# Patient Record
Sex: Male | Born: 1965 | Race: White | Hispanic: No | Marital: Married | Smoking: Current every day smoker
Health system: Southern US, Community
[De-identification: ages and names within clinical notes are randomized; demographics above are authoritative.]

## PROBLEM LIST (undated history)

## (undated) DIAGNOSIS — I10 Essential (primary) hypertension: Secondary | ICD-10-CM

## (undated) DIAGNOSIS — E785 Hyperlipidemia, unspecified: Secondary | ICD-10-CM

## (undated) HISTORY — DX: Hyperlipidemia, unspecified: E78.5

## (undated) HISTORY — DX: Essential (primary) hypertension: I10

## (undated) HISTORY — PX: HAND SURGERY: SHX662

## (undated) HISTORY — PX: TONSILLECTOMY: SUR1361

---

## 2014-08-22 HISTORY — PX: HERNIA REPAIR: SHX51

## 2014-09-26 ENCOUNTER — Other Ambulatory Visit: Payer: Self-pay | Admitting: Cardiology

## 2014-09-26 ENCOUNTER — Ambulatory Visit
Admission: RE | Admit: 2014-09-26 | Discharge: 2014-09-26 | Disposition: A | Discharge status: Home / Self Care | Payer: BLUE CROSS/BLUE SHIELD | Source: Ambulatory Visit | Attending: Cardiology | Admitting: Cardiology

## 2014-09-26 DIAGNOSIS — F17200 Nicotine dependence, unspecified, uncomplicated: Secondary | ICD-10-CM

## 2016-10-27 ENCOUNTER — Ambulatory Visit
Admission: RE | Admit: 2016-10-27 | Discharge: 2016-10-27 | Disposition: A | Discharge status: Home / Self Care | Source: Ambulatory Visit | Attending: Cardiology | Admitting: Cardiology

## 2016-10-27 ENCOUNTER — Other Ambulatory Visit: Payer: Self-pay | Admitting: Cardiology

## 2016-10-27 DIAGNOSIS — I4510 Unspecified right bundle-branch block: Secondary | ICD-10-CM

## 2019-04-09 ENCOUNTER — Other Ambulatory Visit: Payer: Self-pay | Admitting: Cardiology

## 2019-04-09 DIAGNOSIS — I10 Essential (primary) hypertension: Secondary | ICD-10-CM

## 2019-04-09 DIAGNOSIS — R0609 Other forms of dyspnea: Secondary | ICD-10-CM

## 2019-04-26 ENCOUNTER — Ambulatory Visit: Payer: Self-pay | Admitting: Cardiology

## 2019-05-06 ENCOUNTER — Other Ambulatory Visit: Payer: Self-pay

## 2019-05-06 ENCOUNTER — Ambulatory Visit (INDEPENDENT_AMBULATORY_CARE_PROVIDER_SITE_OTHER): Payer: Self-pay

## 2019-05-06 DIAGNOSIS — I10 Essential (primary) hypertension: Secondary | ICD-10-CM | POA: Diagnosis not present

## 2019-05-07 NOTE — Progress Notes (Signed)
S/w patient advised him of results

## 2019-05-27 ENCOUNTER — Encounter: Payer: Self-pay | Admitting: Cardiology

## 2019-05-27 ENCOUNTER — Ambulatory Visit (INDEPENDENT_AMBULATORY_CARE_PROVIDER_SITE_OTHER): Payer: Self-pay | Admitting: Cardiology

## 2019-05-27 VITALS — BP 118/79 | HR 91 | Ht 71.0 in | Wt 273.0 lb

## 2019-05-27 DIAGNOSIS — I2510 Atherosclerotic heart disease of native coronary artery without angina pectoris: Secondary | ICD-10-CM

## 2019-05-27 DIAGNOSIS — E785 Hyperlipidemia, unspecified: Secondary | ICD-10-CM | POA: Insufficient documentation

## 2019-05-27 DIAGNOSIS — E782 Mixed hyperlipidemia: Secondary | ICD-10-CM

## 2019-05-27 DIAGNOSIS — F17200 Nicotine dependence, unspecified, uncomplicated: Secondary | ICD-10-CM

## 2019-05-27 DIAGNOSIS — I10 Essential (primary) hypertension: Secondary | ICD-10-CM

## 2019-05-27 DIAGNOSIS — I4510 Unspecified right bundle-branch block: Secondary | ICD-10-CM

## 2019-05-27 NOTE — Progress Notes (Signed)
Primary Physician/Referring:  Patient, No Pcp Per  Patient ID: Frank Browning, male    DOB: Jan 09, 1966, 53 y.o.   MRN: [id number]  Chief Complaint  Patient presents with  . Hypertension  . Abnormal ECG  . Follow-up   HPI:    Frank Browning  is a 53 y.o. Caucasian male who presents here for follow-up and evaluation of hypertension. Overall he is doing well and denies any chest pain, shortness of breath, PND or orthopnea. No symptoms to suggest TIA or claudication. He is employed by the [employer] and is required to have a CXR, echo, and GXT performed for clearance to continue performing his job.   [facility], history of coronary calcification by CT scan of the chest in 2017, hypertension, right bundle branch block, mixed hyperlipidemia, tobacco use disorder. He is presently doing well and essentially asymptomatic.   Past Medical History:  Diagnosis Date  . Hyperlipidemia   . Hypertension    Past Surgical History:  Procedure Laterality Date  . HAND SURGERY Right   . HERNIA REPAIR  2016  . TONSILLECTOMY     Social History   Socioeconomic History  . Marital status: Married    Spouse name: Not on file  . Number of children: 2  . Years of education: Not on file  . Highest education level: Not on file  Occupational History  . Not on file  Social Needs  . Financial resource strain: Not on file  . Food insecurity    Worry: Not on file    Inability: Not on file  . Transportation needs    Medical: Not on file    Non-medical: Not on file  Tobacco Use  . Smoking status: Current Every Day Smoker    Packs/day: 1.00    Types: Cigarettes  . Smokeless tobacco: Never Used  Substance and Sexual Activity  . Alcohol use: Not Currently  . Drug use: Never  . Sexual activity: Not on file  Lifestyle  . Physical activity    Days per week: Not on file    Minutes per session: Not on file  . Stress: Not on file  Relationships  . Social  Musicianconnections    Talks on phone: Not on file    Gets together: Not on file    Attends religious service: Not on file    Active member of club or organization: Not on file    Attends meetings of clubs or organizations: Not on file    Relationship status: Not on file  . Intimate partner violence    Fear of current or ex partner: Not on file    Emotionally abused: Not on file    Physically abused: Not on file    Forced sexual activity: Not on file  Other Topics Concern  . Not on file  Social History Narrative  . Not on file   ROS  Review of Systems  Constitution: Negative for chills, decreased appetite, malaise/fatigue and weight gain.  Cardiovascular: Negative for dyspnea on exertion, leg swelling and syncope.  Endocrine: Negative for cold intolerance.  Hematologic/Lymphatic: Does not bruise/bleed easily.  Musculoskeletal: Negative for joint swelling.  Gastrointestinal: Negative for abdominal pain, anorexia, change in bowel habit, hematochezia and melena.  Neurological: Negative for headaches and light-headedness.  Psychiatric/Behavioral: Negative for depression and substance abuse.  All other systems reviewed and are negative.  Objective   Vitals with BMI 05/27/2019  Height 5\' 11"   Weight 273  lbs  BMI 38.09  Systolic 118  Diastolic 79  Pulse 91    Blood pressure 118/79, pulse 91, height 5\' 11"  (1.803 m), weight 273 lb (123.8 kg), SpO2 99 %. Body mass index is 38.08 kg/m.   Physical Exam  Constitutional:  He is well-developed and moderately obese in no acute distress.  HENT:  Head: Atraumatic.  Eyes: Conjunctivae are normal.  Neck: Neck supple. No JVD present. No thyromegaly present.  Cardiovascular: Normal rate, regular rhythm, normal heart sounds and intact distal pulses. Exam reveals no gallop.  No murmur heard. Pulmonary/Chest: Effort normal and breath sounds normal.  Abdominal: Soft. Bowel sounds are normal.  Obese abdomen  Musculoskeletal: Normal range of  motion.  Neurological: He is alert.  Skin: Skin is warm and dry.  Psychiatric: He has a normal mood and affect.   Radiology: No results found.  Laboratory examination:   No results for input(s): NA, K, CL, CO2, GLUCOSE, BUN, CREATININE, CALCIUM, GFRNONAA, GFRAA in the last 8760 hours. No flowsheet data found. No flowsheet data found. Lipid Panel  No results found for: CHOL, TRIG, HDL, CHOLHDL, VLDL, LDLCALC, LDLDIRECT HEMOGLOBIN A1C No results found for: HGBA1C, MPG TSH No results for input(s): TSH in the last 8760 hours. Medications and allergies   Allergies  Allergen Reactions  . Bee Venom      Prior to Admission medications   Medication Sig Start Date End Date Taking? Authorizing Provider  aspirin 325 MG tablet Take 325 mg by mouth daily.   Yes [provider]  atorvastatin (LIPITOR) 80 MG tablet Take 80 mg by mouth daily.   Yes [provider]  EPINEPHrine (EPIPEN IJ) Inject as directed as needed.   Yes [provider]  losartan (COZAAR) 100 MG tablet Take 100 mg by mouth daily.   Yes [provider]  omeprazole (PRILOSEC) 20 MG capsule Take 20 mg by mouth daily.   Yes [provider]     Current Outpatient Medications  Medication Instructions  . aspirin 325 mg, Oral, Daily  . atorvastatin (LIPITOR) 80 mg, Oral, Daily  . EPINEPHrine (EPIPEN IJ) Injection, As needed  . losartan (COZAAR) 100 mg, Oral, Daily  . omeprazole (PRILOSEC) 20 mg, Oral, Daily    Cardiac Studies:   CT scan of the chest 08/03/2016: Calcific coronary artery disease is present. Otherwise no significant abnormality.  Exercise treadmill stress test 05/06/2019: Exercise treadmill stress test was performed using Bruce protocol. Patient reached 11.2 METS, and 108% of age predicted maximum heart rate. Exercise capacity was excellent. Chest pain not reported. Heart rate and hemodynamic response were normal. Stress EKG revealed no ischemic changes. Low risk  study.  Echocardiogram 05/06/2019: Left ventricle cavity is normal in size. Moderate concentric hypertrophy of the left ventricle. Normal LV systolic function with EF 64%. Normal global wall motion. Normal diastolic filling pattern.  Trace mitral regurgitation. Mild tricuspid regurgitation.  No evidence of pulmonary hypertension. No significant change compared to previous study on 04/19/2018.  Assessment     ICD-10-CM   1. Essential hypertension  I10 EKG 12-Lead  2. RBBB  I45.10   3. Coronary artery calcification seen on CAT scan  I25.10   4. Tobacco use disorder  F17.200   5. Mixed hyperlipidemia  E78.2     EKG 05/27/2019: Normal sinus rhythm at rate of 77 bpm, right axis deviation, left posterior fascicular block.  Right bundle branch block.  Bifascicular block.  Poor R-wave progression, borderline low voltage complexes, pulmonary disease pattern. No  significant change from  EKG 10/26/2016.  Recommendations:   Mr. Frank Browning is a Caucasian male who travels frequently with Pharmacist, hospital and follows at [facility] for primary care needs, history of coronary calcification by CT scan of the chest in 2017, hypertension, right bundle branch block, mixed hyperlipidemia, tobacco use disorder presents to me for annual visit to obtain clearance to continue to work in the [employer].  His blood pressure is well controlled, he does have hyperglycemia and hyperlipidemia, I do not have this labs.  I have discussed with him regarding smoking cessation again.  Weight loss discussed.  I reviewed his echocardiogram and stress test with the patient.  He requests hardcopy of the same, will send by MyChart.  He sees me once every 2 years, he will call me again when he has to recertify.  He is scheduled for complete physical and lab evaluation by the [place] next month.  This OV is visible to the patient after he signs up for MyChart. Adrian Prows, MD, [facility] 05/27/2019, 4:08 PM [facility]. [place] Pager:  [phone] Office: [phone] If no answer Cell [phone]

## 2019-07-01 NOTE — Telephone Encounter (Signed)
Labs 06/13/2019: PSA normal, urine analysis normal, BUN 14, creatinine 1.03, potassium 3.9, CMP normal.  Total cholesterol 122, triglycerides 126, HDL 44, LDL 53.  TSH normal at 2.97.  CBC normal.  A1c 6.5%.

## 2019-11-06 ENCOUNTER — Other Ambulatory Visit: Payer: Self-pay | Admitting: *Deleted

## 2019-11-06 ENCOUNTER — Telehealth: Payer: Self-pay

## 2019-11-06 ENCOUNTER — Ambulatory Visit
Admission: RE | Admit: 2019-11-06 | Discharge: 2019-11-06 | Disposition: A | Discharge status: Home / Self Care | Source: Ambulatory Visit | Attending: *Deleted | Admitting: *Deleted

## 2019-11-06 DIAGNOSIS — J42 Unspecified chronic bronchitis: Secondary | ICD-10-CM

## 2019-11-06 DIAGNOSIS — R9389 Abnormal findings on diagnostic imaging of other specified body structures: Secondary | ICD-10-CM

## 2019-11-06 DIAGNOSIS — R0609 Other forms of dyspnea: Secondary | ICD-10-CM

## 2019-11-06 NOTE — Telephone Encounter (Signed)
I left the paper work on B desk. I sent in referral to pulmonary

## 2019-11-06 NOTE — Telephone Encounter (Signed)
Patient dropped off paperwork that he needs filled out by a Pulmonary doctor, so he can start working. He is asking can you put in a referral for him to be seen by pulmonary. He does not have PCP. He left copy of form so you would know what he needs Pulmonary referral for.

## 2019-11-07 NOTE — Telephone Encounter (Signed)
Spoke with patient and let him know that referral was sent to Pulmonary. Patient verbalized understanding.

## 2019-11-15 NOTE — Telephone Encounter (Signed)
From pt

## 2019-11-15 NOTE — Telephone Encounter (Signed)
I placed a referral to pulmonary, did they get it or there is a problem with Epic

## 2020-01-21 NOTE — Telephone Encounter (Signed)
Per pt please read

## 2020-09-04 ENCOUNTER — Telehealth: Payer: Self-pay

## 2020-09-04 IMAGING — DX DG CHEST 2V
2 series · 2 of 2 positions shown · non-contrast
Comparison: 10/27/2016

CLINICAL DATA: Bronchitis, history of prior outside abnormal chest
radiograph ([facility]

EXAM:
CHEST - 2 VIEW

[dg chest 2 view (1 of 2)]
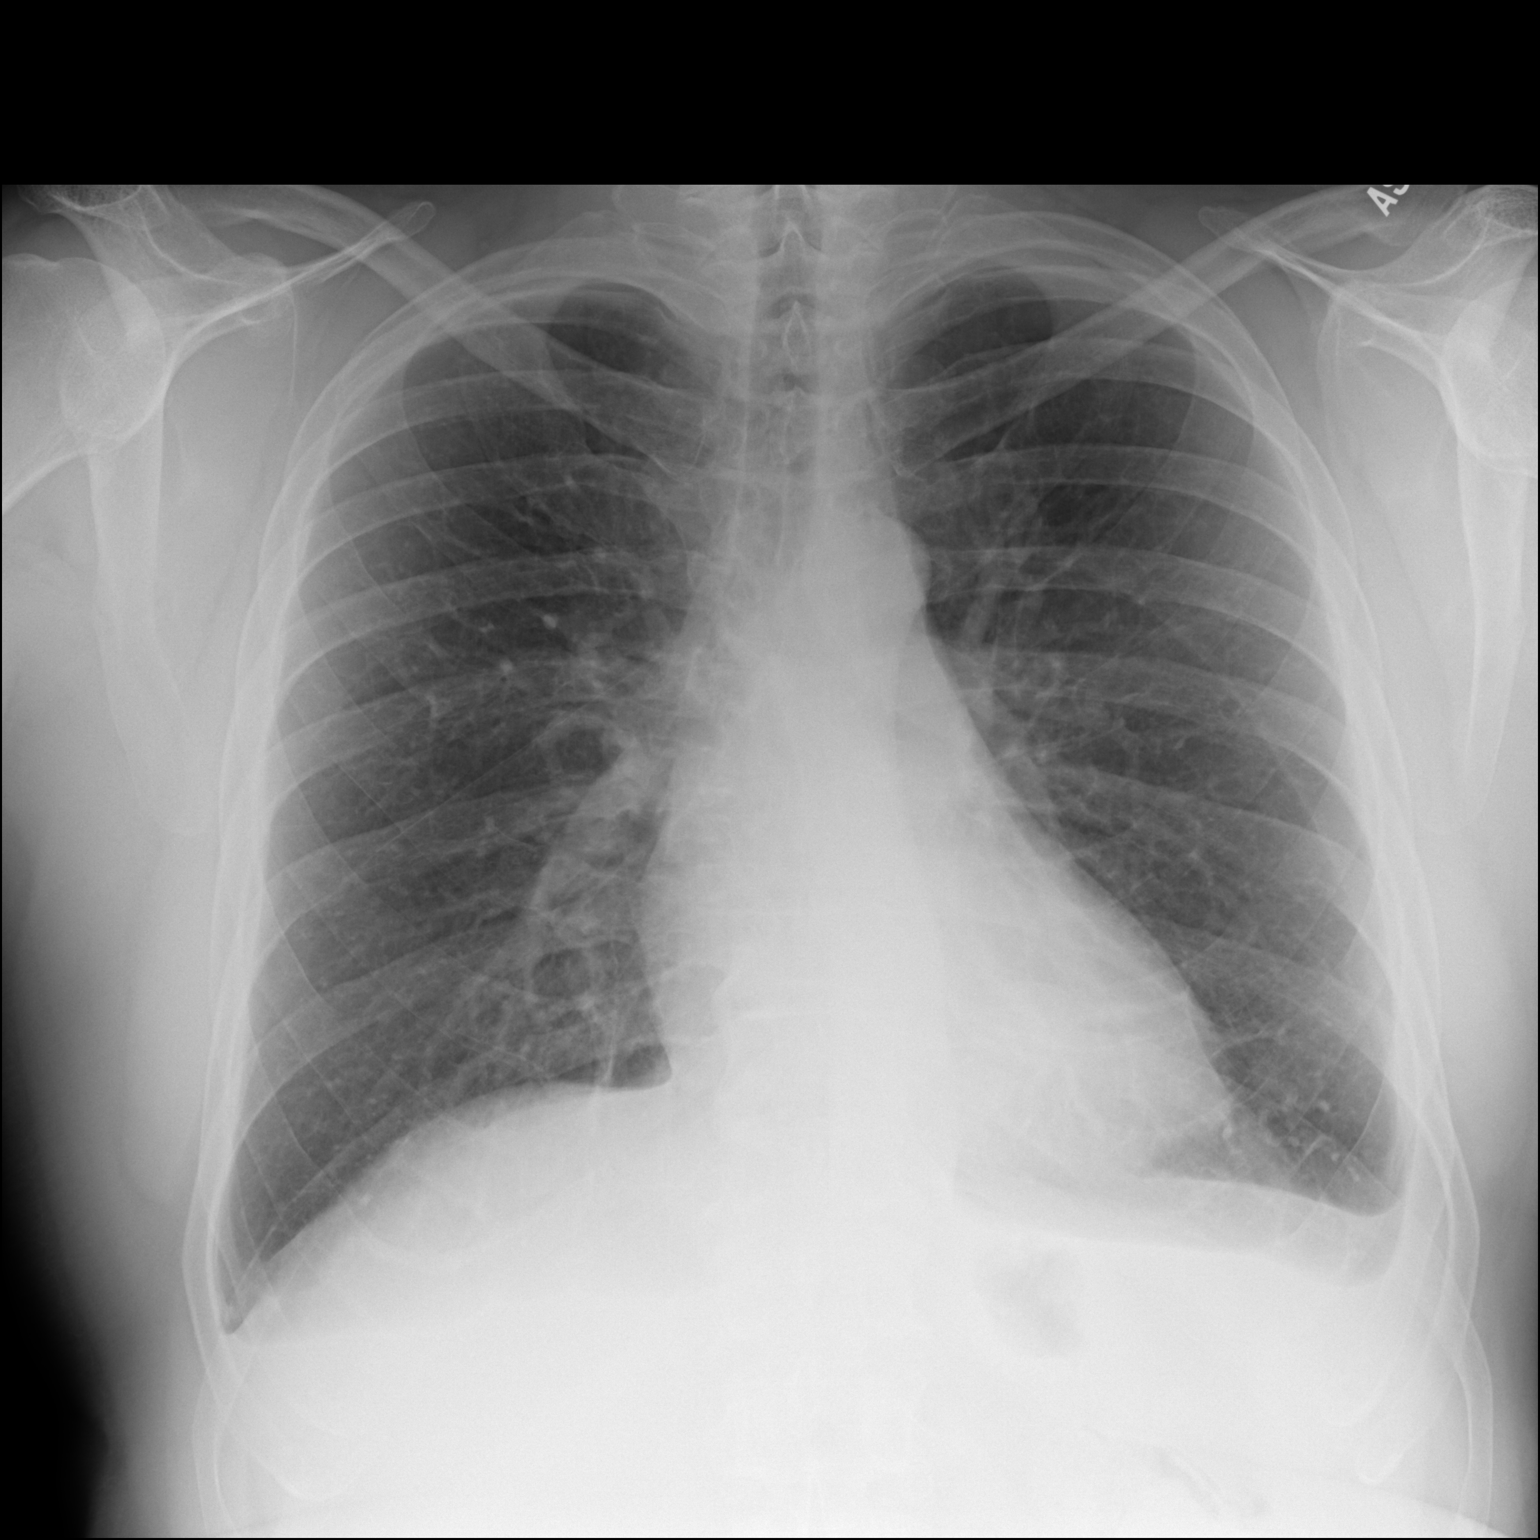

[dg chest 2 view (2 of 2)]
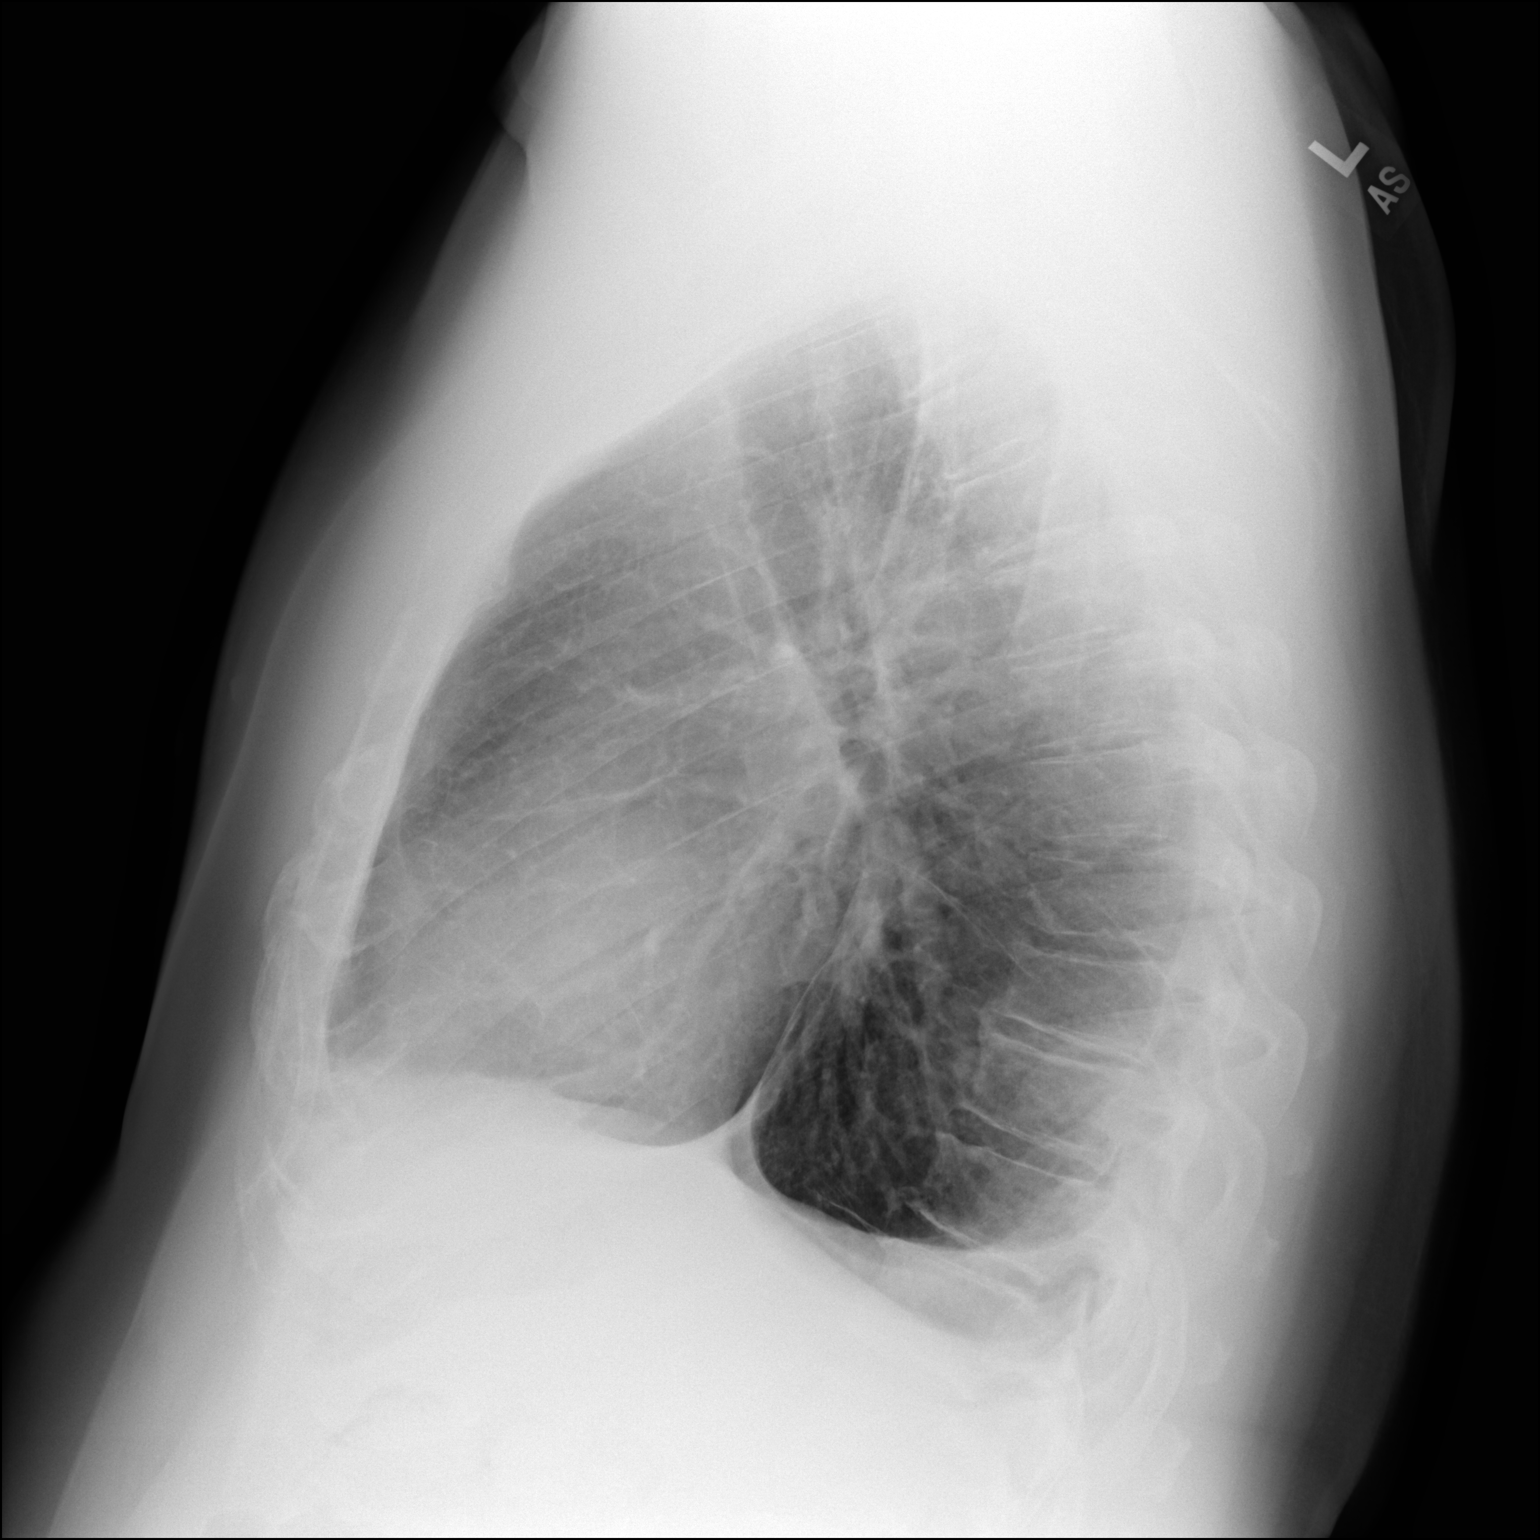

[2 of 2 positions shown; findings below may reference images not displayed]

FINDINGS: Normal heart size, mediastinal contours, and pulmonary vascularity.

Small pleural effusions blunt the costophrenic angles bilaterally,
slightly greater on LEFT.

Lungs otherwise clear.

No pulmonary infiltrate or pneumothorax.

Osseous structures unremarkable.
IMPRESSION: Small bibasilar pleural effusions, greater on LEFT.

## 2020-09-04 NOTE — Telephone Encounter (Signed)
Can you check to see if this referral was ever completed.

## 2021-03-24 ENCOUNTER — Encounter: Payer: Self-pay | Admitting: Pulmonary Disease

## 2021-04-20 ENCOUNTER — Encounter: Payer: Self-pay | Admitting: Pulmonary Disease

## 2021-04-20 ENCOUNTER — Other Ambulatory Visit: Payer: Self-pay

## 2021-04-20 ENCOUNTER — Ambulatory Visit (INDEPENDENT_AMBULATORY_CARE_PROVIDER_SITE_OTHER): Payer: No Typology Code available for payment source | Admitting: Pulmonary Disease

## 2021-04-20 VITALS — BP 118/76 | HR 64 | Ht 71.0 in | Wt 255.2 lb

## 2021-04-20 DIAGNOSIS — J42 Unspecified chronic bronchitis: Secondary | ICD-10-CM

## 2021-04-20 DIAGNOSIS — J90 Pleural effusion, not elsewhere classified: Secondary | ICD-10-CM

## 2021-04-20 NOTE — Progress Notes (Signed)
 @Patient ID: Frank Browning, male    DOB: 07/13/1966, 55 y.o.   MRN: [id number]  Chief Complaint  Patient presents with   Consult    [facility]. States the pain has been mostly on the right side of his chest and right side of his back.     Referring provider: Bhuvaneswaran, Anitha, *  HPI:   55 y.o. man whom we are seeing in consultation for [facility].  [facility] reviewed.  Most recent cardiology note October 2020 reviewed.  Overall patient doing okay.  He describes a couple month history of right-sided chest pain.  Worse with deep inspiration, movement, exertion.  Also occurs at rest.  No time of day when things are better or worse.  No seasonal or environmental factors that makes it better or worse.  No other alleviating or exacerbating factors.  [facility].  10/2020 had CT scan that does not describe significant pleural effusions but did describe bilateral lower lobe atelectasis.  Repeat CT scan 02/2021 with report bilateral right greater than left pleural effusions, 6 mm left upper lobe nodule.  This prompted referral.  He says for the chest pain he was worked up with what sounds like a nuclear stress test that he was told was normal.  He said the stuff they injected of his veins made him feel weird and he did not like it.  Reports history of chronic bronchitis.  Treated with various nasal sprays over time.  Not very effective.  Seems to have used ICS inhalers and albuterol in the past.  Although later he says he was directed to take ICS inhaler and with a modification spray in his nose.  Reviewed chest x-ray 10/2019 on my interpretation shows small left-sided pleural effusion.  Otherwise clear lungs.  Reviewed chest x-ray 11/08/2016 and 10/11/2014 that does not demonstrate any evidence of pleural effusion and also shows clear lungs.  PMH: Hypertension, diabetes, hyperlipidemia, allergies, sleep apnea, chronic  bronchitis Surgical history: Bilateral hernia repair February 2016 Family history: Pancreatic cancer in father, CAD in grandparents Social history: Current smoker, 1 pack/day, works in the [employer], lives in [place]   Questionaires / Pulmonary Flowsheets:   ACT:  No flowsheet data found.  MMRC: No flowsheet data found.  Epworth:  No flowsheet data found.  Tests:   FENO:  No results found for: NITRICOXIDE  PFT: No flowsheet data found.  WALK:  No flowsheet data found.  Imaging: Personally reviewed as per EMR discussion of this note  Lab Results:  CBC No results found for: WBC, RBC, HGB, HCT, PLT, MCV, MCH, MCHC, RDW, LYMPHSABS, MONOABS, EOSABS, BASOSABS  BMET No results found for: NA, K, CL, CO2, GLUCOSE, BUN, CREATININE, CALCIUM, GFRNONAA, GFRAA  BNP No results found for: BNP  ProBNP No results found for: PROBNP  Specialty Problems   None   Allergies  Allergen Reactions   Bee Venom      There is no immunization history on file for this patient.  Past Medical History:  Diagnosis Date   Hyperlipidemia    Hypertension     Tobacco History: Social History   Tobacco Use  Smoking Status Every Day   Packs/day: 1.00   Types: Cigarettes  Smokeless Tobacco Never   Ready to quit: Not Answered Counseling given: Not Answered   Continue to not smoke  Outpatient Encounter Medications as of 04/20/2021  Medication Sig   aspirin   325 MG tablet Take 325 mg by mouth daily.   atorvastatin (LIPITOR) 80 MG tablet Take 80 mg by mouth daily.   calcium carbonate (TUMS - DOSED IN MG ELEMENTAL CALCIUM) 500 MG chewable tablet Chew 1 tablet by mouth daily.   Cholecalciferol 50 MCG (2000 UT) CAPS Take by mouth.   fluticasone (FLONASE) 50 MCG/ACT nasal spray Place 1 spray into both nostrils daily.   losartan (COZAAR) 100 MG tablet Take 100 mg by mouth daily.   metFORMIN (GLUCOPHAGE) 500 MG tablet Take by mouth 2 (two) times daily with a meal.   Omega-3  Fatty Acids (FISH OIL) 1000 MG CAPS Take by mouth.   [DISCONTINUED] aspirin 325 MG tablet Take 325 mg by mouth daily.   [DISCONTINUED] atorvastatin (LIPITOR) 80 MG tablet Take 80 mg by mouth daily.   [DISCONTINUED] EPINEPHrine (EPIPEN IJ) Inject as directed as needed.   [DISCONTINUED] losartan (COZAAR) 100 MG tablet Take 100 mg by mouth daily.   [DISCONTINUED] omeprazole (PRILOSEC) 20 MG capsule Take 20 mg by mouth daily.   No facility-administered encounter medications on file as of 04/20/2021.     Review of Systems  Review of Systems  No orthopnea or PND.  No lower extremity swelling.  Complete review systems otherwise negative. Physical Exam  BP 118/76 (BP Location: Left Arm, Patient Position: Sitting, Cuff Size: Large)   Pulse 64   Ht 5\' 11"  (1.803 m)   Wt 255 lb 3.2 oz (115.8 kg)   SpO2 98% Comment: on RA  BMI 35.59 kg/m   Wt Readings from Last 5 Encounters:  04/20/21 255 lb 3.2 oz (115.8 kg)  05/27/19 273 lb (123.8 kg)    BMI Readings from Last 5 Encounters:  04/20/21 35.59 kg/m  05/27/19 38.08 kg/m     Physical Exam General: Sitting in chair, no distress Eyes: EOMI, no icterus Neck: Supple, no JVP Cardiovascular: Regular rhythm, no murmurs Pulmonary: Diminished bilateral bases, otherwise clear to auscultation, normal work of breathing Abdomen: Nondistended, bowel sounds present MSK: No synovitis, no joint effusion Neuro: Normal gait, no weakness Psych: Normal mood, full affect   Assessment & Plan:   Right-sided pleuritic chest pain: Suspect related to right pleural effusion.  Possible related to hiatal hernia, esophageal pathology.  Pleural effusions: Right given left based on CT report 02/2021 from the 03/2021.  Review of chest x-ray 10/2019 reveals left-sided effusion that appears stable from chest x-ray 1/21 (in 2/21 system I cannot view) per report.  Given bilateral nature, and presence for approaching 2 years, prior LVH and hypertension, suspect related to  cardiogenic volume overload.  It seems he has not been worked up in the [place] system.  Discussed role of thoracentesis for both therapeutic benefit but also for diagnosis given they have not been sampled before he expressed understanding and wishes to pursue this.  We will try to arrange with colleague in the coming day versus myself in the next week or 2.  Pulmonary nodule: Left upper lobe 6 mm, per Fleischner criteria will obtain CT scan in 6 months for follow-up.  He is unsure [facility] will [facility] versus [facility], stressed the importance of ensuring he gets a repeat CT scan January 2023.  He expressed understanding and said he would arrange or call February 2023 back if needed to be done here.   Return in about 2 months (around 06/20/2021).   06/22/2021, MD 04/20/2021

## 2021-04-20 NOTE — Patient Instructions (Signed)
Nice to meet you  Try to arrange the thoracentesis for Thursday afternoon.  If not, I will arrange for next week.  I recommend we do this procedure to help figure out why the fluid is there.  I am most suspicious is effective from the heart symptoms on both sides of your chest, around both lungs not just 1.  Return to clinic in 2 months or sooner as needed with Dr. Judeth Horn

## 2021-04-20 NOTE — H&P (View-Only) (Signed)
@Patient  ID: , male    DOB: August 06, 1966, 55 y.o.   MRN: [id number]  Chief Complaint  Patient presents with   Consult    Referred by [facility] for possible pleural effusion. States the pain has been mostly on the right side of his chest and right side of his back.     Referring provider: [facility], *  HPI:   55 y.o. man whom we are seeing in consultation for evaluation of pleural effusion from the 55.  Note from [place] reviewed.  Most recent cardiology note October 2020 reviewed.  Overall patient doing okay.  He describes a couple month history of right-sided chest pain.  Worse with deep inspiration, movement, exertion.  Also occurs at rest.  No time of day when things are better or worse.  No seasonal or environmental factors that makes it better or worse.  No other alleviating or exacerbating factors.  Worked up via the November 2020.  10/2020 had CT scan that does not describe significant pleural effusions but did describe bilateral lower lobe atelectasis.  Repeat CT scan 02/2021 with report bilateral right greater than left pleural effusions, 6 mm left upper lobe nodule.  This prompted referral.  He says for the chest pain he was worked up with what sounds like a nuclear stress test that he was told was normal.  He said the stuff they injected of his veins made him feel weird and he did not like it.  Reports history of chronic bronchitis.  Treated with various nasal sprays over time.  Not very effective.  Seems to have used ICS inhalers and albuterol in the past.  Although later he says he was directed to take ICS inhaler and with a modification spray in his nose.  Reviewed chest x-ray 10/2019 on my interpretation shows small left-sided pleural effusion.  Otherwise clear lungs.  Reviewed chest x-ray 11/08/2016 and 10/11/2014 that does not demonstrate any evidence of pleural effusion and also shows clear lungs.  PMH: Hypertension, diabetes, hyperlipidemia, allergies, sleep apnea, chronic  bronchitis Surgical history: Bilateral hernia repair February 2016 Family history: Pancreatic cancer in father, CAD in grandparents Social history: Current smoker, 1 pack/day, works in the March 2016, lives in [place] / Pulmonary Flowsheets:   ACT:  No flowsheet data found.  MMRC: No flowsheet data found.  Epworth:  No flowsheet data found.  Tests:   FENO:  No results found for: NITRICOXIDE  PFT: No flowsheet data found.  WALK:  No flowsheet data found.  Imaging: Personally reviewed as per EMR discussion of this note  Lab Results:  CBC No results found for: WBC, RBC, HGB, HCT, PLT, MCV, MCH, MCHC, RDW, LYMPHSABS, MONOABS, EOSABS, BASOSABS  BMET No results found for: NA, K, CL, CO2, GLUCOSE, BUN, CREATININE, CALCIUM, GFRNONAA, GFRAA  BNP No results found for: BNP  ProBNP No results found for: PROBNP  Specialty Problems   None   Allergies  Allergen Reactions   Bee Venom      There is no immunization history on file for this patient.  Past Medical History:  Diagnosis Date   Hyperlipidemia    Hypertension     Tobacco History: Social History   Tobacco Use  Smoking Status Every Day   Packs/day: 1.00   Types: Cigarettes  Smokeless Tobacco Never   Ready to quit: Not Answered Counseling given: Not Answered   Continue to not smoke  Outpatient Encounter Medications as of 56/30/2022  Medication Sig   aspirin  325 MG tablet Take 325 mg by mouth daily.   atorvastatin (LIPITOR) 80 MG tablet Take 80 mg by mouth daily.   calcium carbonate (TUMS - DOSED IN MG ELEMENTAL CALCIUM) 500 MG chewable tablet Chew 1 tablet by mouth daily.   Cholecalciferol 50 MCG (2000 UT) CAPS Take by mouth.   fluticasone (FLONASE) 50 MCG/ACT nasal spray Place 1 spray into both nostrils daily.   losartan (COZAAR) 100 MG tablet Take 100 mg by mouth daily.   metFORMIN (GLUCOPHAGE) 500 MG tablet Take by mouth 2 (two) times daily with a meal.   Omega-3  Fatty Acids (FISH OIL) 1000 MG CAPS Take by mouth.   [DISCONTINUED] aspirin 325 MG tablet Take 325 mg by mouth daily.   [DISCONTINUED] atorvastatin (LIPITOR) 80 MG tablet Take 80 mg by mouth daily.   [DISCONTINUED] EPINEPHrine (EPIPEN IJ) Inject as directed as needed.   [DISCONTINUED] losartan (COZAAR) 100 MG tablet Take 100 mg by mouth daily.   [DISCONTINUED] omeprazole (PRILOSEC) 20 MG capsule Take 20 mg by mouth daily.   No facility-administered encounter medications on file as of 04/20/2021.     Review of Systems  Review of Systems  No orthopnea or PND.  No lower extremity swelling.  Complete review systems otherwise negative. Physical Exam  BP 118/76 (BP Location: Left Arm, Patient Position: Sitting, Cuff Size: Large)   Pulse 64   Ht 5\' 11"  (1.803 m)   Wt 255 lb 3.2 oz (115.8 kg)   SpO2 98% Comment: on RA  BMI 35.59 kg/m   Wt Readings from Last 5 Encounters:  04/20/21 255 lb 3.2 oz (115.8 kg)  05/27/19 273 lb (123.8 kg)    BMI Readings from Last 5 Encounters:  04/20/21 35.59 kg/m  05/27/19 38.08 kg/m     Physical Exam General: Sitting in chair, no distress Eyes: EOMI, no icterus Neck: Supple, no JVP Cardiovascular: Regular rhythm, no murmurs Pulmonary: Diminished bilateral bases, otherwise clear to auscultation, normal work of breathing Abdomen: Nondistended, bowel sounds present MSK: No synovitis, no joint effusion Neuro: Normal gait, no weakness Psych: Normal mood, full affect   Assessment & Plan:   Right-sided pleuritic chest pain: Suspect related to right pleural effusion.  Possible related to hiatal hernia, esophageal pathology.  Pleural effusions: Right given left based on CT report 02/2021 from the 03/2021.  Review of chest x-ray 10/2019 reveals left-sided effusion that appears stable from chest x-ray 1/21 (in 2/21 system I cannot view) per report.  Given bilateral nature, and presence for approaching 2 years, prior LVH and hypertension, suspect related to  cardiogenic volume overload.  It seems he has not been worked up in the [place] system.  Discussed role of thoracentesis for both therapeutic benefit but also for diagnosis given they have not been sampled before he expressed understanding and wishes to pursue this.  We will try to arrange with colleague in the coming day versus myself in the next week or 2.  Pulmonary nodule: Left upper lobe 6 mm, per Fleischner criteria will obtain CT scan in 6 months for follow-up.  He is unsure [facility], stressed the importance of ensuring he gets a repeat CT scan January 2023.  He expressed understanding and said he would arrange or call February 2023 back if needed to be done here.   Return in about 2 months (around 06/20/2021).   06/22/2021, MD 04/20/2021

## 2021-04-21 ENCOUNTER — Telehealth: Payer: Self-pay | Admitting: Pulmonary Disease

## 2021-04-21 NOTE — Telephone Encounter (Signed)
Patient scheduled for Thora on 04/22/21 at [facility] at 2pm. Patient is to be there at 1:30pm.  They are to arrive through the main entrance to go to Admitting.   Called spoke with patient let him know this information. And he was requesting it be sent to his mychart; which I have done.   Nothing further needed at this time.

## 2021-04-22 ENCOUNTER — Encounter (HOSPITAL_COMMUNITY)
Admission: RE | Disposition: A | Discharge status: Home / Self Care | Payer: Self-pay | Source: Home / Self Care | Attending: Pulmonary Disease

## 2021-04-22 ENCOUNTER — Ambulatory Visit (HOSPITAL_COMMUNITY)
Admission: RE | Admit: 2021-04-22 | Discharge: 2021-04-22 | Disposition: A | Discharge status: Home / Self Care | Payer: No Typology Code available for payment source | Attending: Pulmonary Disease | Admitting: Pulmonary Disease

## 2021-04-22 ENCOUNTER — Encounter (HOSPITAL_COMMUNITY): Payer: Self-pay | Admitting: Pulmonary Disease

## 2021-04-22 DIAGNOSIS — Z538 Procedure and treatment not carried out for other reasons: Secondary | ICD-10-CM | POA: Diagnosis not present

## 2021-04-22 DIAGNOSIS — J90 Pleural effusion, not elsewhere classified: Secondary | ICD-10-CM | POA: Diagnosis present

## 2021-04-22 SURGERY — CANCELLED PROCEDURE

## 2021-04-22 NOTE — Interval H&P Note (Signed)
History and Physical Interval Note:  04/22/2021 2:52 PM  Frank Browning  has presented today for surgery, with the diagnosis of pleural effusion.  The various methods of treatment have been discussed with the patient and family. After consideration of risks, benefits and other options for treatment, the patient has consented to  Procedure(s): CANCELLED PROCEDURE as a surgical intervention.  The patient's history has been reviewed, patient examined, no change in status, stable for surgery.  I have reviewed the patient's chart and labs.  Questions were answered to the patient's satisfaction.     Martina Sinner

## 2021-04-22 NOTE — Progress Notes (Signed)
Patient arrived for thoracentesis and has very small pleural effusions. Dr. Francine Graven cancelling procedure for today.

## 2021-04-22 NOTE — Op Note (Signed)
Patient was scheduled for thoracentesis due to bilateral pleural effusions.   Upon ultrasound evaluation, there are small simple bilateral effusions. Given the size of the effusions I felt the risks outweighed the benefits of performing the procedure. I recommended a trial of diuretic therapy with a follow up chest radiograph at the next follow up visit with Dr. Judeth Horn in October.   I have informed Dr. Judeth Horn of today's findings.  Melody Comas, MD [facility] Office: [phone]   See Amion for personal pager PCCM on call pager [phone] until 7pm. Please call Elink 7p-7a. [phone]

## 2021-04-27 DIAGNOSIS — J90 Pleural effusion, not elsewhere classified: Secondary | ICD-10-CM

## 2021-04-27 NOTE — Telephone Encounter (Signed)
MH please adivse. Thanks  Riki Sheer [facility] Pool Dr. Judeth Horn,   As your colleague has probably informed you, the procedure was not performed.     So, what is next?     And, will I be prescribed any medication? If so, has the rqmt been transmitted to my [place] provider?   Thank you for your time.   V/R

## 2021-05-11 MED ORDER — FUROSEMIDE 20 MG PO TABS: 20.0000 mg | ORAL_TABLET | Freq: Every day | ORAL | 0 refills | Status: DC

## 2021-05-11 NOTE — Telephone Encounter (Signed)
I have blood labs from [id number] (attached). Will any of those work so I can obtain and start the medication?   BRgds,   Margaretha Glassing

## 2021-05-11 NOTE — Telephone Encounter (Signed)
Hello Dr. Judeth Horn,  Please see mychart message below, thanks!  Dr. Judeth Horn,   I haven't started/received the medication yet.  The prescription is not in the [place] system. I talked to the [place] community care person. She told me that you should be able to authorize blood tests as part of the course of treatment.  Much easier to do them with you guys I think.   Also, I'll be travelling for the next 2 weeks. I depart early Weds. morning (21SEP) and I'll be back towards the end of the first week of October.  I reckon I'll end up driving about 4-3O miles.   Best Regards,   Frank Browning

## 2021-05-20 NOTE — Telephone Encounter (Signed)
Hello Dr. Judeth Horn, please see mychart message below, thanks  What time of day should I take the med

## 2021-05-28 ENCOUNTER — Other Ambulatory Visit (INDEPENDENT_AMBULATORY_CARE_PROVIDER_SITE_OTHER): Payer: No Typology Code available for payment source

## 2021-05-28 DIAGNOSIS — J90 Pleural effusion, not elsewhere classified: Secondary | ICD-10-CM

## 2021-05-28 LAB — BASIC METABOLIC PANEL
BUN: 16 mg/dL (ref 6–23)
CO2: 29 mEq/L (ref 19–32)
Calcium: 9.6 mg/dL (ref 8.4–10.5)
Chloride: 104 mEq/L (ref 96–112)
Creatinine, Ser: 0.83 mg/dL (ref 0.40–1.50)
GFR: 97.93 mL/min (ref >60.00)
Glucose, Bld: 100 mg/dL — ABNORMAL HIGH (ref 70–99)
Potassium: 3.9 mEq/L (ref 3.5–5.1)
Sodium: 139 mEq/L (ref 135–145)

## 2021-05-28 LAB — BRAIN NATRIURETIC PEPTIDE: Pro B Natriuretic peptide (BNP): 10 pg/mL (ref 0.0–100.0)

## 2021-05-28 LAB — MAGNESIUM: Magnesium: 1.8 mg/dL (ref 1.5–2.5)

## 2021-05-31 NOTE — Progress Notes (Signed)
Blood work looks ok

## 2021-06-15 ENCOUNTER — Other Ambulatory Visit: Payer: Self-pay

## 2021-06-15 ENCOUNTER — Ambulatory Visit: Payer: No Typology Code available for payment source

## 2021-06-15 ENCOUNTER — Encounter: Payer: Self-pay | Admitting: Pulmonary Disease

## 2021-06-15 ENCOUNTER — Ambulatory Visit (INDEPENDENT_AMBULATORY_CARE_PROVIDER_SITE_OTHER): Payer: No Typology Code available for payment source | Admitting: Pulmonary Disease

## 2021-06-15 VITALS — BP 132/82 | HR 81 | Temp 98.3°F | Ht 71.0 in | Wt 252.4 lb

## 2021-06-15 DIAGNOSIS — R053 Chronic cough: Secondary | ICD-10-CM | POA: Diagnosis not present

## 2021-06-15 DIAGNOSIS — R0609 Other forms of dyspnea: Secondary | ICD-10-CM | POA: Diagnosis not present

## 2021-06-15 MED ORDER — FUROSEMIDE 20 MG PO TABS: 20.0000 mg | ORAL_TABLET | Freq: Every day | ORAL | 3 refills | Status: AC

## 2021-06-15 NOTE — Patient Instructions (Signed)
Nice to see you again  Continue taking Lasix 20 mg daily  At next visit we will get a chest x-ray, pulmonary function test, lab work.  I will work on the paperwork you gave me  Return to clinic early January 2023 with pulmonary function test same day, to see Dr. Judeth Horn

## 2021-06-15 NOTE — Progress Notes (Signed)
@Patient  ID: , male    DOB: 29-Jan-1966, 55 y.o.   MRN: [id number]  Chief Complaint  Patient presents with   Follow-up    Patient states coughing and SOB has improved    Referring provider: Clinic, [id number]  HPI:   55 y.o. man whom we are seeing in consultation for follow-up of pleural effusion and dyspnea on exertion from the 59.    Since last visit had scheduled thoracentesis.  No tappable pocket at time of evaluation.  Discussed with patient via phone.  Decided to trial low-dose Lasix, 20 mg daily.  Repeat labs with stable electrolytes, creatinine.  Overall seems improved symptoms.  Less chest tightness, chest discomfort.  Improved dyspnea.  Improved cough.  He endorses GERD refractory to PPI.  Uses Tums a couple times a week.  Still has ongoing dry cough.  HPI at initial visit Overall patient doing okay.  He describes a couple month history of right-sided chest pain.  Worse with deep inspiration, movement, exertion.  Also occurs at rest.  No time of day when things are better or worse.  No seasonal or environmental factors that makes it better or worse.  No other alleviating or exacerbating factors.  Worked up via the [place].  10/2020 had CT scan that does not describe significant pleural effusions but did describe bilateral lower lobe atelectasis.  Repeat CT scan 02/2021 with report bilateral right greater than left pleural effusions, 6 mm left upper lobe nodule.  This prompted referral.  He says for the chest pain he was worked up with what sounds like a nuclear stress test that he was told was normal.  He said the stuff they injected of his veins made him feel weird and he did not like it.  Reports history of chronic bronchitis.  Treated with various nasal sprays over time.  Not very effective.  Seems to have used ICS inhalers and albuterol in the past.  Although later he says he was directed to take ICS inhaler and with a modification spray in his nose.  Reviewed  chest x-ray 10/2019 on my interpretation shows small left-sided pleural effusion.  Otherwise clear lungs.  Reviewed chest x-ray 11/08/2016 and 10/11/2014 that does not demonstrate any evidence of pleural effusion and also shows clear lungs.  PMH: Hypertension, diabetes, hyperlipidemia, allergies, sleep apnea, chronic bronchitis Surgical history: Bilateral hernia repair February 2016 Family history: Pancreatic cancer in father, CAD in grandparents Social history: Current smoker, 1 pack/day, works in the March 2016, lives in [employer] / Pulmonary Flowsheets:   ACT:  No flowsheet data found.  MMRC: No flowsheet data found.  Epworth:  No flowsheet data found.  Tests:   FENO:  No results found for: NITRICOXIDE  PFT: No flowsheet data found.  WALK:  No flowsheet data found.  Imaging: Personally reviewed as per EMR discussion of this note  Lab Results:  CBC No results found for: WBC, RBC, HGB, HCT, PLT, MCV, MCH, MCHC, RDW, LYMPHSABS, MONOABS, EOSABS, BASOSABS  BMET    Component Value Date/Time   NA 139 05/28/2021 0903   K 3.9 05/28/2021 0903   CL 104 05/28/2021 0903   CO2 29 05/28/2021 0903   GLUCOSE 100 (H) 05/28/2021 0903   BUN 16 05/28/2021 0903   CREATININE 0.83 05/28/2021 0903   CALCIUM 9.6 05/28/2021 0903    BNP No results found for: BNP  ProBNP    Component Value Date/Time   PROBNP 10.0 05/28/2021 0903  Specialty Problems   None   Allergies  Allergen Reactions   Bee Venom     As a child    Immunization History  Administered Date(s) Administered   Influenza,inj,Quad PF,6+ Mos 06/03/2021    Past Medical History:  Diagnosis Date   Hyperlipidemia    Hypertension     Tobacco History: Social History   Tobacco Use  Smoking Status Every Day   Types: Cigarettes  Smokeless Tobacco Never  Tobacco Comments   Less than 1 ppd   Ready to quit: Not Answered Counseling given: Not Answered Tobacco comments: Less than 1  ppd   Continue to not smoke  Outpatient Encounter Medications as of 06/15/2021  Medication Sig   aspirin 325 MG EC tablet Take 325 mg by mouth daily.   atorvastatin (LIPITOR) 80 MG tablet Take 80 mg by mouth daily with supper.   calcium carbonate (TUMS - DOSED IN MG ELEMENTAL CALCIUM) 500 MG chewable tablet Chew 2 tablets by mouth every 8 (eight) hours as needed for indigestion or heartburn.   Cholecalciferol 50 MCG (2000 UT) CAPS Take 2,000 Units by mouth daily.   fluticasone (FLONASE) 50 MCG/ACT nasal spray Place 1 spray into both nostrils daily. As needed   losartan (COZAAR) 100 MG tablet Take 100 mg by mouth daily.   metFORMIN (GLUCOPHAGE) 500 MG tablet Take 500 mg by mouth 2 (two) times daily with a meal.   Omega-3 Fatty Acids (FISH OIL) 1000 MG CAPS Take 1,000 mg by mouth daily.   omeprazole (PRILOSEC) 40 MG capsule Take 40 mg by mouth daily with breakfast.   polyvinyl alcohol (LIQUIFILM TEARS) 1.4 % ophthalmic solution Place 1 drop into both eyes every 4 (four) hours as needed for dry eyes.   [DISCONTINUED] furosemide (LASIX) 20 MG tablet Take 1 tablet (20 mg total) by mouth daily.   furosemide (LASIX) 20 MG tablet Take 1 tablet (20 mg total) by mouth daily.   No facility-administered encounter medications on file as of 06/15/2021.     Review of Systems  Review of Systems  N/a Physical Exam  BP 132/82 (BP Location: Right Arm, Cuff Size: Normal)   Pulse 81   Temp 98.3 F (36.8 C)   Ht 5\' 11"  (1.803 m)   Wt 252 lb 6.4 oz (114.5 kg)   SpO2 97%   BMI 35.20 kg/m   Wt Readings from Last 5 Encounters:  06/15/21 252 lb 6.4 oz (114.5 kg)  04/20/21 255 lb 3.2 oz (115.8 kg)  05/27/19 273 lb (123.8 kg)    BMI Readings from Last 5 Encounters:  06/15/21 35.20 kg/m  04/20/21 35.59 kg/m  05/27/19 38.08 kg/m     Physical Exam General: Sitting in chair, no distress Eyes: EOMI, no icterus Neck: Supple, no JVP Cardiovascular: Regular rhythm, no murmurs Pulmonary:   clear to auscultation, normal work of breathing Abdomen: Nondistended, bowel sounds present MSK: No synovitis, no joint effusion Neuro: Normal gait, no weakness Psych: Normal mood, full affect   Assessment & Plan:   Right-sided pleuritic chest pain: Suspect related to right pleural effusion.  Possible related to hiatal hernia, esophageal pathology.  Improving with diuresis.  DOE: Improved with Lasix.  PFTs at next visit for further evaluation.  Reportedly normal spirometry in the past per his report.  Pleural effusions: Right given left based on CT report 02/2021 from the 03/2021.  Review of chest x-ray 10/2019 reveals left-sided effusion that appears stable from chest x-ray 1/21 (in 2/21 system I cannot view) per report.  Given bilateral nature, and presence for approaching 2 years, prior LVH and hypertension, suspect related to cardiogenic volume overload.  pocket on attempted thoracentesis 04/2021.  symptoms and exam improved with Lasix therapy.  Plan for repeat chest x-ray, BMP at next visit 08/2021.   Pulmonary nodule: Left upper lobe 6 mm, per Fleischner criteria will obtain CT scan in 6 months for follow-up.  [place] [facility] [place] [facility] [place], stressed the importance of ensuring he gets a repeat CT scan January 2023.  He expressed understanding and said he would arrange or call us back if needed to be done here.   Return in about 3 months (around 09/15/2021).   Karren Burly, MD 06/15/2021

## 2021-06-23 NOTE — Telephone Encounter (Signed)
Just FYI  Dr. Judeth Horn,   Just letting you know I scheduled the CT [facility] for [id number] [facility].   Hopefully, the reading will be available prior to our appt on 06JAN.   V/R   Margaretha Glassing

## 2021-06-24 NOTE — Telephone Encounter (Signed)
Will forward to MH to review.

## 2021-08-27 ENCOUNTER — Ambulatory Visit (INDEPENDENT_AMBULATORY_CARE_PROVIDER_SITE_OTHER): Payer: No Typology Code available for payment source | Admitting: Pulmonary Disease

## 2021-08-27 ENCOUNTER — Other Ambulatory Visit: Payer: Self-pay

## 2021-08-27 ENCOUNTER — Encounter: Payer: Self-pay | Admitting: Pulmonary Disease

## 2021-08-27 VITALS — BP 130/72 | HR 82 | Temp 98.8°F | Ht 71.0 in | Wt 266.0 lb

## 2021-08-27 DIAGNOSIS — R0609 Other forms of dyspnea: Secondary | ICD-10-CM

## 2021-08-27 DIAGNOSIS — R053 Chronic cough: Secondary | ICD-10-CM

## 2021-08-27 LAB — PULMONARY FUNCTION TEST
DL/VA % pred: 102 %
DL/VA: 4.42 ml/min/mmHg/L
DLCO cor % pred: 74 %
DLCO cor: 21.67 ml/min/mmHg
DLCO unc % pred: 74 %
DLCO unc: 21.67 ml/min/mmHg
FEF 25-75 Post: 2.3 L/sec
FEF 25-75 Pre: 1.97 L/sec
FEF2575-%Change-Post: 16 %
FEF2575-%Pred-Post: 70 %
FEF2575-%Pred-Pre: 60 %
FEV1-%Change-Post: 4 %
FEV1-%Pred-Post: 68 %
FEV1-%Pred-Pre: 66 %
FEV1-Post: 2.67 L
FEV1-Pre: 2.56 L
FEV1FVC-%Change-Post: 3 %
FEV1FVC-%Pred-Pre: 96 %
FEV6-%Change-Post: 1 %
FEV6-%Pred-Post: 71 %
FEV6-%Pred-Pre: 71 %
FEV6-Post: 3.49 L
FEV6-Pre: 3.45 L
FEV6FVC-%Pred-Post: 104 %
FEV6FVC-%Pred-Pre: 104 %
FVC-%Change-Post: 1 %
FVC-%Pred-Post: 69 %
FVC-%Pred-Pre: 68 %
FVC-Post: 3.49 L
FVC-Pre: 3.45 L
Post FEV1/FVC ratio: 76 %
Post FEV6/FVC ratio: 100 %
Pre FEV1/FVC ratio: 74 %
Pre FEV6/FVC Ratio: 100 %
RV % pred: 62 %
RV: 1.39 L
TLC % pred: 67 %
TLC: 4.84 L

## 2021-08-27 NOTE — Progress Notes (Signed)
Full PFT performed today. °

## 2021-08-27 NOTE — Progress Notes (Signed)
@Patient  ID: , male    DOB: 1966-05-18, 56 y.o.   MRN: [id number]  Chief Complaint  Patient presents with   Follow-up    Follow up for cough. Pt states that cough comes and goes.     Referring provider: Clinic, [id number]  HPI:   56 y.o. man whom we are seeing in consultation for follow-up of pleural effusion and dyspnea on exertion from the 59.    Since last visit, no changes.  No change in symptoms.  Overall doing well.  Continues Lasix 20 mg daily.  PFT performed.  Reviewed in depth.  Consistent with moderate restriction and mild DLCO reduction.  Reviewed reported most recent CT scan 2022 that does not demonstrate any findings of ILD per the report.  Suspect largely related to chest wall interference, body habitus.  HPI at initial visit Overall patient doing okay.  He describes a couple month history of right-sided chest pain.  Worse with deep inspiration, movement, exertion.  Also occurs at rest.  No time of day when things are better or worse.  No seasonal or environmental factors that makes it better or worse.  No other alleviating or exacerbating factors.  Worked up via the 2023.  10/2020 had CT scan that does not describe significant pleural effusions but did describe bilateral lower lobe atelectasis.  Repeat CT scan 02/2021 with report bilateral right greater than left pleural effusions, 6 mm left upper lobe nodule.  This prompted referral.  He says for the chest pain he was worked up with what sounds like a nuclear stress test that he was told was normal.  He said the stuff they injected of his veins made him feel weird and he did not like it.  Reports history of chronic bronchitis.  Treated with various nasal sprays over time.  Not very effective.  Seems to have used ICS inhalers and albuterol in the past.  Although later he says he was directed to take ICS inhaler and with a modification spray in his nose.  Reviewed chest x-ray 10/2019 on my interpretation shows  small left-sided pleural effusion.  Otherwise clear lungs.  Reviewed chest x-ray 11/08/2016 and 10/11/2014 that does not demonstrate any evidence of pleural effusion and also shows clear lungs.  PMH: Hypertension, diabetes, hyperlipidemia, allergies, sleep apnea, chronic bronchitis Surgical history: Bilateral hernia repair February 2016 Family history: Pancreatic cancer in father, CAD in grandparents Social history: Current smoker, 1 pack/day, works in the March 2016, lives in [employer] / Pulmonary Flowsheets:   ACT:  No flowsheet data found.  MMRC: No flowsheet data found.  Epworth:  No flowsheet data found.  Tests:   FENO:  No results found for: NITRICOXIDE  PFT: PFT Results Latest Ref Rng & Units 08/27/2021  FVC-Pre L 3.45  FVC-Predicted Pre % 68  FVC-Post L 3.49  FVC-Predicted Post % 69  Pre FEV1/FVC % % 74  Post FEV1/FCV % % 76  FEV1-Pre L 2.56  FEV1-Predicted Pre % 66  FEV1-Post L 2.67  DLCO uncorrected ml/min/mmHg 21.67  DLCO UNC% % 74  DLCO corrected ml/min/mmHg 21.67  DLCO COR %Predicted % 74  DLVA Predicted % 102  TLC L 4.84  TLC % Predicted % 67  RV % Predicted % 62  Personally reviewed and interpreted as spirometry suggestive of moderate restriction, no bronchodilator response, TLC consistent with moderate restriction, DLCO mildly reduced just below lower limit of normal  WALK:  No flowsheet data found.  Imaging:  Personally reviewed as per EMR discussion of this note  Lab Results:  CBC No results found for: WBC, RBC, HGB, HCT, PLT, MCV, MCH, MCHC, RDW, LYMPHSABS, MONOABS, EOSABS, BASOSABS  BMET    Component Value Date/Time   NA 139 05/28/2021 0903   K 3.9 05/28/2021 0903   CL 104 05/28/2021 0903   CO2 29 05/28/2021 0903   GLUCOSE 100 (H) 05/28/2021 0903   BUN 16 05/28/2021 0903   CREATININE 0.83 05/28/2021 0903   CALCIUM 9.6 05/28/2021 0903    BNP No results found for: BNP  ProBNP    Component Value Date/Time    PROBNP 10.0 05/28/2021 0903    Specialty Problems   None   Allergies  Allergen Reactions   Bee Venom     As a child    Immunization History  Administered Date(s) Administered   Influenza,inj,Quad PF,6+ Mos 06/03/2021    Past Medical History:  Diagnosis Date   Hyperlipidemia    Hypertension     Tobacco History: Social History   Tobacco Use  Smoking Status Every Day   Types: Cigarettes  Smokeless Tobacco Never  Tobacco Comments   Less than 1 ppd   Ready to quit: Not Answered Counseling given: Not Answered Tobacco comments: Less than 1 ppd   Continue to not smoke  Outpatient Encounter Medications as of 08/27/2021  Medication Sig   aspirin 325 MG EC tablet Take 325 mg by mouth daily.   atorvastatin (LIPITOR) 80 MG tablet Take 80 mg by mouth daily with supper.   calcium carbonate (TUMS - DOSED IN MG ELEMENTAL CALCIUM) 500 MG chewable tablet Chew 2 tablets by mouth every 8 (eight) hours as needed for indigestion or heartburn.   Cholecalciferol 50 MCG (2000 UT) CAPS Take 2,000 Units by mouth daily.   fluticasone (FLONASE) 50 MCG/ACT nasal spray Place 1 spray into both nostrils daily. As needed   furosemide (LASIX) 20 MG tablet Take 1 tablet (20 mg total) by mouth daily.   losartan (COZAAR) 100 MG tablet Take 100 mg by mouth daily.   metFORMIN (GLUCOPHAGE) 500 MG tablet Take 500 mg by mouth 2 (two) times daily with a meal.   Omega-3 Fatty Acids (FISH OIL) 1000 MG CAPS Take 1,000 mg by mouth daily.   omeprazole (PRILOSEC) 40 MG capsule Take 40 mg by mouth daily with breakfast.   polyvinyl alcohol (LIQUIFILM TEARS) 1.4 % ophthalmic solution Place 1 drop into both eyes every 4 (four) hours as needed for dry eyes.   No facility-administered encounter medications on file as of 08/27/2021.     Review of Systems  Review of Systems  N/a Physical Exam  BP 130/72 (BP Location: Left Arm, Cuff Size: Normal)    Pulse 82    Temp 98.8 F (37.1 C) (Oral)    Ht 5\' 11"  (1.803 m)     Wt 266 lb (120.7 kg)    SpO2 97%    BMI 37.10 kg/m   Wt Readings from Last 5 Encounters:  08/27/21 266 lb (120.7 kg)  06/15/21 252 lb 6.4 oz (114.5 kg)  04/20/21 255 lb 3.2 oz (115.8 kg)  05/27/19 273 lb (123.8 kg)    BMI Readings from Last 5 Encounters:  08/27/21 37.10 kg/m  06/15/21 35.20 kg/m  04/20/21 35.59 kg/m  05/27/19 38.08 kg/m     Physical Exam General: Sitting in chair, no distress Eyes: EOMI, no icterus Neck: Supple, no JVP Cardiovascular: Regular rhythm, no murmurs Pulmonary:  clear to auscultation, normal work of  breathing Abdomen: Nondistended, bowel sounds present MSK: No synovitis, no joint effusion Neuro: Normal gait, no weakness Psych: Normal mood, full affect   Assessment & Plan:   Right-sided pleuritic chest pain: Suspect related to right pleural effusion.  Possible related to hiatal hernia, esophageal pathology.  Resolved with diuresis.  DOE: Improved with Lasix.  PFTs consistent with moderate restriction with nearly preserved DLCO, no mention of ILD on CT scan from [place] in 2022.  Reportedly normal spirometry in the past per his report.  Pleural effusions: Right greater than left based on CT report 02/2021 from the [place].  Review of chest x-ray 10/2019 reveals left-sided effusion that appears stable from chest x-ray 1/21 (in [place] system I cannot view) per report.  Given bilateral nature, and presence for approaching 2 years, prior LVH and hypertension, suspect related to cardiogenic volume overload.  pocket on attempted thoracentesis 04/2021.  symptoms and exam improved with Lasix therapy.     Pulmonary nodule: Left upper lobe 6 mm and 02/2021, per Fleischner criteria recommend CT scan in 6 months for follow-up.  This was performed 08/26/2021 via the [place], results not known to the patient yet.   No follow-ups on file.   Karren Burly, MD 08/27/2021

## 2021-08-27 NOTE — Patient Instructions (Signed)
Full PFT performed today. °

## 2021-08-29 ENCOUNTER — Encounter: Payer: Self-pay | Admitting: Pulmonary Disease

## 2023-10-13 ENCOUNTER — Encounter: Payer: Self-pay | Admitting: Pulmonary Disease
